# Patient Record
Sex: Male | Born: 1965 | Race: White | Hispanic: No | Marital: Single | Smoking: Never smoker
Health system: Southern US, Community
[De-identification: ages and names within clinical notes are randomized; demographics above are authoritative.]

## PROBLEM LIST (undated history)

## (undated) DIAGNOSIS — S022XXA Fracture of nasal bones, initial encounter for closed fracture: Secondary | ICD-10-CM

## (undated) DIAGNOSIS — E079 Disorder of thyroid, unspecified: Secondary | ICD-10-CM

## (undated) DIAGNOSIS — S42309A Unspecified fracture of shaft of humerus, unspecified arm, initial encounter for closed fracture: Secondary | ICD-10-CM

## (undated) DIAGNOSIS — B54 Unspecified malaria: Secondary | ICD-10-CM

## (undated) DIAGNOSIS — K589 Irritable bowel syndrome without diarrhea: Secondary | ICD-10-CM

## (undated) HISTORY — DX: Fracture of nasal bones, initial encounter for closed fracture: S02.2XXA

## (undated) HISTORY — DX: Disorder of thyroid, unspecified: E07.9

## (undated) HISTORY — DX: Unspecified fracture of shaft of humerus, unspecified arm, initial encounter for closed fracture: S42.309A

## (undated) HISTORY — DX: Unspecified malaria: B54

## (undated) HISTORY — DX: Irritable bowel syndrome, unspecified: K58.9

## (undated) HISTORY — PX: OTHER SURGICAL HISTORY: SHX169

## (undated) HISTORY — PX: EYE SURGERY: SHX253

## (undated) HISTORY — PX: TONSILLECTOMY AND ADENOIDECTOMY: SHX28

---

## 2013-03-29 ENCOUNTER — Ambulatory Visit: Payer: Self-pay | Admitting: Family Medicine

## 2013-03-29 VITALS — BP 105/70 | HR 60 | Temp 97.8°F | Resp 18 | Ht 75.0 in | Wt 214.0 lb

## 2013-03-29 DIAGNOSIS — E039 Hypothyroidism, unspecified: Secondary | ICD-10-CM

## 2013-03-29 LAB — TSH: TSH: 1.976 u[IU]/mL (ref 0.350–4.500)

## 2013-03-29 MED ORDER — LEVOTHYROXINE SODIUM 125 MCG PO TABS: 125.0000 ug | ORAL_TABLET | Freq: Every day | ORAL | Status: DC

## 2013-03-29 NOTE — Progress Notes (Signed)
[facility] [address] [place] [address] [phone]  Date:  03/29/2013   Name:  Michael Mccall   DOB:  [birth year]   MRN:  [id number]  PCP:  Kyung Rudd, MD    Chief Complaint: pre employment check up and rx refills   History of Present Illness:  Michael Mccall is a 47 y.o. very pleasant male patient who presents with the following:  He just got hired for a new job in [place].  He is a Primary school teacher projects He needs a physical- just for his job, would like "a basic physical."  He has already seen a travel clinic and had the appropriate vaccines He also needs a refill of his levothyroxine.  He would like to get a 6 month rx to tide him over during his time overseas He had a TSH check about 6 months ago.  He has been stable on his current does for 2 years.    He is quite healthy except for a history of hypothyroidism  There are no active problems to display for this patient.   Past Medical History  Diagnosis Date  . Thyroid disease     Past Surgical History  Procedure Laterality Date  . Eye surgery    . Nose repair      History  Substance Use Topics  . Smoking status: Never Smoker   . Smokeless tobacco: Not on file  . Alcohol Use: Not on file    History reviewed. No pertinent family history.  No Known Allergies  Medication list has been reviewed and updated.  No current outpatient prescriptions on file prior to visit.   No current facility-administered medications on file prior to visit.    Review of Systems:  As per HPI- otherwise negative.   Physical Examination: Filed Vitals:   03/29/13 1256  BP: 98/62  Pulse: 51  Temp: 97.8 F (36.6 C)  Resp: 18   Filed Vitals:   03/29/13 1256  Height: 6\' 3"  (1.905 m)  Weight: 214 lb (97.07 kg)   Body mass index is 26.75 kg/(m^2). Ideal Body Weight: Weight in (lb) to have BMI = 25: 199.6  GEN: WDWN, NAD, Non-toxic, A & O x 3, looks well HEENT:  Atraumatic, Normocephalic. Neck supple. No masses, No LAD.  Bilateral TM wnl, oropharynx normal.  PEERL,EOMI.   Ears and Nose: No external deformity. CV: RRR, No M/G/R. No JVD. No thrill. No extra heart sounds. PULM: CTA B, no wheezes, crackles, rhonchi. No retractions. No resp. distress. No accessory muscle use. ABD: S, NT, ND EXTR: No c/c/e NEURO Normal gait.  PSYCH: Normally interactive. Conversant. Not depressed or anxious appearing.  Calm demeanor.    Assessment and Plan: Unspecified hypothyroidism - Plan: TSH, levothyroxine (SYNTHROID, LEVOTHROID) 125 MCG tablet  Completed basic PE form for his job today.  Will call with TSH result- if necessary will change his dose of levothyroxine.  He will get a flu shot at the drug store.   Signed Abbe Amsterdam, MD

## 2013-03-29 NOTE — Patient Instructions (Addendum)
Have a great and safe experience!   I will be in touch with your TSH value in the next 1 or 2 days.  Assuming your TSH is normal we can continue your current medication dose.

## 2013-04-07 ENCOUNTER — Ambulatory Visit (INDEPENDENT_AMBULATORY_CARE_PROVIDER_SITE_OTHER): Payer: Self-pay | Admitting: Physician Assistant

## 2013-04-07 ENCOUNTER — Ambulatory Visit: Payer: Self-pay

## 2013-04-07 VITALS — BP 120/80 | HR 60 | Temp 98.0°F | Resp 16 | Ht 75.0 in | Wt 217.0 lb

## 2013-04-07 DIAGNOSIS — K589 Irritable bowel syndrome without diarrhea: Secondary | ICD-10-CM | POA: Insufficient documentation

## 2013-04-07 DIAGNOSIS — Z0000 Encounter for general adult medical examination without abnormal findings: Secondary | ICD-10-CM

## 2013-04-07 DIAGNOSIS — Z111 Encounter for screening for respiratory tuberculosis: Secondary | ICD-10-CM

## 2013-04-07 LAB — POCT URINALYSIS DIPSTICK
Bilirubin, UA: NEGATIVE
Glucose, UA: NEGATIVE
Ketones, UA: NEGATIVE
Spec Grav, UA: 1.02

## 2013-04-07 LAB — POCT UA - MICROSCOPIC ONLY
Bacteria, U Microscopic: NEGATIVE
RBC, urine, microscopic: NEGATIVE

## 2013-04-07 NOTE — Progress Notes (Signed)
Thanks Chelle

## 2013-04-07 NOTE — Patient Instructions (Signed)
Return in 48-72 hours for TB test reading. You may return anytime for fasting labs.

## 2013-04-07 NOTE — Progress Notes (Signed)
  Subjective:    Patient ID: Michael Mccall, male    DOB: 1966/04/24, 47 y.o.   MRN: [id number]  HPI  This 47 y.o. male presents for labs and additional items required for a PE for work.  He returned from the [place] this spring, and is preparing for a position in [place] with [employer].  He was seen here by Dr. Patsy Lager for the exam portion of this work PE earlier this month, but did not have the form or know the additional required labs, EKG and CXR.  Today he brought his vaccine record, which I reviewed and entered into the EMR.   Review of Systems     Objective:   Physical Exam Exam performed by Dr. Patsy Lager 03/29/2013 reviewed.  EKG: NSR, early repolarization.  No ischemia or irregular rhythm.  CXR: [facility] reading (PRIMARY) by  Dr. Clelia Croft.  Questionable borderline cardiomegaly.  No infiltrates or pneumothorax.      Assessment & Plan:  Routine general medical examination at a health care facility - Plan: EKG 12-Lead, POCT UA - Microscopic Only, POCT urinalysis dipstick, IFOBT POC (occult bld, rslt in office), DG Chest 2 View, POCT CBC, Comprehensive metabolic panel, RPR, Glucose 6 phosphate dehydrogenase  Screening-pulmonary TB - Plan: TB Skin Test  He'll return for the PPD reading and for fasting labs (CBC, CMET, G6PD, RPR), orders for which have been placed. Return the home collected hemosure as well.  Fernande Bras, PA-C Physician Assistant-Certified [facility]   Tuberculosis Risk Questionnaire  1. No Were you born outside the [place] in one of the following parts of the world: [place], [place], [place], [place] or [place]?    2. Yes ([place]) Have you traveled outside the [place] and lived for more than one month in one of the following parts of the world: [place], [place], [place], [place] or [place]?    3. No Do you have a compromised immune system such as from any of the following  conditions:HIV/AIDS, organ or bone marrow transplantation, diabetes, immunosuppressive medicines (e.g. Prednisone, Remicaide), leukemia, lymphoma, cancer of the head or neck, gastrectomy or jejunal bypass, end-stage renal disease (on dialysis), or silicosis?     4. No Have you ever or do you plan on working in: a residential care center, a health care facility, a jail or prison or homeless shelter?    5. No Have you ever: injected illegal drugs, used crack cocaine, lived in a homeless shelter  or been in jail or prison?     6. Yes  Have you ever been exposed to anyone with infectious tuberculosis?    Tuberculosis Symptom Questionnaire  Do you currently have any of the following symptoms?  1. No Unexplained cough lasting more than 3 weeks?   2. No Unexplained fever lasting more than 3 weeks.   3. No Night Sweats (sweating that leaves the bedclothes and sheets wet)     4. No Shortness of Breath   5. No Chest Pain   6. No Unintentional weight loss    7. No Unexplained fatigue (very tired for no reason)

## 2013-04-08 ENCOUNTER — Other Ambulatory Visit: Payer: Self-pay | Admitting: Physician Assistant

## 2013-04-08 ENCOUNTER — Other Ambulatory Visit (INDEPENDENT_AMBULATORY_CARE_PROVIDER_SITE_OTHER): Payer: Self-pay

## 2013-04-08 DIAGNOSIS — Z0000 Encounter for general adult medical examination without abnormal findings: Secondary | ICD-10-CM

## 2013-04-08 LAB — RPR

## 2013-04-08 LAB — POCT CBC
HCT, POC: 46 % (ref 43.5–53.7)
Hemoglobin: 14.9 g/dL (ref 14.1–18.1)
Lymph, poc: 1.9 (ref 0.6–3.4)
MCHC: 32.4 g/dL (ref 31.8–35.4)
MCV: 93.7 fL (ref 80–97)
POC LYMPH PERCENT: 33.3 %L (ref 10–50)
RDW, POC: 13.9 %
WBC: 5.6 10*3/uL (ref 4.6–10.2)

## 2013-04-08 LAB — COMPREHENSIVE METABOLIC PANEL
Albumin: 4.3 g/dL (ref 3.5–5.2)
Alkaline Phosphatase: 106 U/L (ref 39–117)
BUN: 11 mg/dL (ref 6–23)
CO2: 21 mEq/L (ref 19–32)
Glucose, Bld: 88 mg/dL (ref 70–99)
Total Bilirubin: 0.6 mg/dL (ref 0.3–1.2)

## 2013-04-08 LAB — GLUCOSE 6 PHOSPHATE DEHYDROGENASE: G-6PDH: 11.4 U/g Hgb (ref 7.0–20.5)

## 2013-04-08 LAB — IFOBT (OCCULT BLOOD): IFOBT: NEGATIVE

## 2013-04-08 NOTE — Progress Notes (Signed)
Patient here for labs only. 

## 2013-04-09 ENCOUNTER — Ambulatory Visit (INDEPENDENT_AMBULATORY_CARE_PROVIDER_SITE_OTHER): Payer: Self-pay | Admitting: Radiology

## 2013-04-09 DIAGNOSIS — Z111 Encounter for screening for respiratory tuberculosis: Secondary | ICD-10-CM

## 2013-04-09 LAB — TB SKIN TEST
Induration: 0 mm
TB Skin Test: NEGATIVE

## 2013-04-09 NOTE — Progress Notes (Signed)
  Subjective:    Patient ID: Michael Mccall, male    DOB: April 10, 1966, 47 y.o.   MRN: [id number]  HPI    Review of Systems     Objective:   Physical Exam        Assessment & Plan:

## 2013-04-09 NOTE — Progress Notes (Deleted)
  Subjective:    Patient ID: Michael Mccall, male    DOB: 05/19/1966, 47 y.o.   MRN: [id number]  HPI    Review of Systems     Objective:   Physical Exam        Assessment & Plan:   

## 2013-04-10 LAB — LIPID PANEL
Cholesterol: 165 mg/dL (ref 0–200)
HDL: 38 mg/dL — ABNORMAL LOW (ref >39)
Total CHOL/HDL Ratio: 4.3 Ratio

## 2013-04-10 LAB — URIC ACID: Uric Acid, Serum: 5.3 mg/dL (ref 4.0–7.8)

## 2014-01-09 ENCOUNTER — Ambulatory Visit (INDEPENDENT_AMBULATORY_CARE_PROVIDER_SITE_OTHER): Payer: Self-pay | Admitting: Physician Assistant

## 2014-01-09 ENCOUNTER — Encounter: Payer: Self-pay | Admitting: Physician Assistant

## 2014-01-09 ENCOUNTER — Telehealth: Payer: Self-pay | Admitting: Physician Assistant

## 2014-01-09 VITALS — BP 122/78 | HR 60 | Temp 98.6°F | Resp 16 | Ht 75.0 in | Wt 221.0 lb

## 2014-01-09 DIAGNOSIS — E039 Hypothyroidism, unspecified: Secondary | ICD-10-CM

## 2014-01-09 DIAGNOSIS — Z114 Encounter for screening for human immunodeficiency virus [HIV]: Secondary | ICD-10-CM

## 2014-01-09 DIAGNOSIS — Z113 Encounter for screening for infections with a predominantly sexual mode of transmission: Secondary | ICD-10-CM

## 2014-01-09 DIAGNOSIS — Z1159 Encounter for screening for other viral diseases: Secondary | ICD-10-CM

## 2014-01-09 LAB — CBC WITH DIFFERENTIAL/PLATELET
BASOS ABS: 0.1 10*3/uL (ref 0.0–0.1)
BASOS PCT: 1 % (ref 0–1)
Eosinophils Absolute: 0.1 10*3/uL (ref 0.0–0.7)
Eosinophils Relative: 1 % (ref 0–5)
HCT: 43.5 % (ref 39.0–52.0)
Hemoglobin: 15 g/dL (ref 13.0–17.0)
LYMPHS PCT: 34 % (ref 12–46)
Lymphs Abs: 2.4 10*3/uL (ref 0.7–4.0)
MCH: 30.2 pg (ref 26.0–34.0)
MCHC: 34.5 g/dL (ref 30.0–36.0)
MCV: 87.5 fL (ref 78.0–100.0)
Monocytes Absolute: 0.5 10*3/uL (ref 0.1–1.0)
Monocytes Relative: 7 % (ref 3–12)
NEUTROS ABS: 4.1 10*3/uL (ref 1.7–7.7)
NEUTROS PCT: 57 % (ref 43–77)
PLATELETS: 279 10*3/uL (ref 150–400)
RBC: 4.97 MIL/uL (ref 4.22–5.81)
RDW: 14 % (ref 11.5–15.5)
WBC: 7.2 10*3/uL (ref 4.0–10.5)

## 2014-01-09 LAB — LIPID PANEL
CHOLESTEROL: 176 mg/dL (ref 0–200)
HDL: 36 mg/dL — ABNORMAL LOW (ref >39)
LDL Cholesterol: 107 mg/dL — ABNORMAL HIGH (ref 0–99)
TRIGLYCERIDES: 165 mg/dL — AB (ref <150)
Total CHOL/HDL Ratio: 4.9 Ratio
VLDL: 33 mg/dL (ref 0–40)

## 2014-01-09 LAB — COMPREHENSIVE METABOLIC PANEL
ALK PHOS: 103 U/L (ref 39–117)
ALT: 31 U/L (ref 0–53)
AST: 18 U/L (ref 0–37)
Albumin: 4.5 g/dL (ref 3.5–5.2)
BUN: 12 mg/dL (ref 6–23)
CHLORIDE: 106 meq/L (ref 96–112)
CO2: 24 mEq/L (ref 19–32)
Calcium: 9.5 mg/dL (ref 8.4–10.5)
Creat: 0.93 mg/dL (ref 0.50–1.35)
Glucose, Bld: 82 mg/dL (ref 70–99)
POTASSIUM: 4.1 meq/L (ref 3.5–5.3)
SODIUM: 138 meq/L (ref 135–145)
Total Bilirubin: 0.3 mg/dL (ref 0.2–1.2)
Total Protein: 6.8 g/dL (ref 6.0–8.3)

## 2014-01-09 NOTE — Patient Instructions (Signed)
I will contact you with your lab results as soon as they are available.   If you have not heard from me in 2 weeks, please contact me.  The fastest way to get your results is to register for My Chart (see the instructions on the last page of this printout).   

## 2014-01-09 NOTE — Telephone Encounter (Signed)
Spoke with patient by phone, while he was in the exam room, to collect history related to his recent travel from [place].  Spoke with [name] at the Infection Prevention line. Michael Mccall is no longer on the watch list.

## 2014-01-09 NOTE — Progress Notes (Signed)
   Subjective:    Patient ID: Gery PrayMichael Scovill, male    DOB: 01-13-1966, 48 y.o.   MRN: [id number]   PCP: Abbe AmsterdamOPLAND,JESSICA, MD  Chief Complaint  Patient presents with  . Hypothyroidism    needs TSH   Medications, allergies, past medical history, surgical history, family history, social history and problem list reviewed and updated.   HPI  This patient presents for TSH.    He was brought to a room with a mask urgently after indicating that he recently returned from [place]. He was in [place] from 03/2013 until 01/05/2014.  He did not travel to [place], [place], [place] or [place].  At the end of his trip, he stayed in a hotel that was not as clean as usual, and after a night there, he developed traveller's diarrhea and started Cipro on 01/05/2014.  His last episode of loose stool was yesterday or the day before. He has a ST that developed at the same time, which he believes is due to a bad filter on the [facility] in his hotel room (when he turned it off, his ST and drainage improved, but it was too hot to keep it off).  He denies contact with anyone ill, attendance at any funerals.  In addition, he would like to be tested "for the whole panel."  He likes to make sure that he's healthy.  We discussed labs that he may want, and decided on CMET, CBC, Lipids, and STI screening.  Review of Systems No chest pain, SOB, HA, dizziness, vision change, N/V, constipation, dysuria, urinary urgency or frequency, myalgias, arthralgias or rash. No bleeding. No fever.     Objective:   Physical Exam  Constitutional: He is oriented to person, place, and time. He appears well-developed and well-nourished.  BP 122/78  Pulse 60  Temp(Src) 98.6 F (37 C)  Resp 16  Ht 6\' 3"  (1.905 m)  Wt 221 lb (100.245 kg)  BMI 27.62 kg/m2  SpO2 97%   Eyes: Conjunctivae are normal. No scleral icterus.  Neck: Neck supple. No thyromegaly present.  Cardiovascular: Normal rate, regular rhythm and normal heart sounds.     Pulmonary/Chest: Effort normal and breath sounds normal.  Lymphadenopathy:    He has no cervical adenopathy.  Neurological: He is alert and oriented to person, place, and time.  Skin: Skin is warm and dry.  Psychiatric: His speech is normal and behavior is normal. Judgment and thought content normal. His mood appears anxious. His affect is not angry, not blunt, not labile and not inappropriate. Cognition and memory are normal. He does not exhibit a depressed mood.          Assessment & Plan:  1. Unspecified hypothyroidism Await lab results. - POCT CBC - TSH - CBC with Differential - Comprehensive metabolic panel - Lipid panel  2. Routine screening for STI (sexually transmitted infection) - RPR - GC/Chlamydia Probe Amp  3. Need for hepatitis B screening test Completed 3 doses of vaccine series.   - Hepatitis B surface antibody  4. Need for hepatitis C screening test - Hepatitis C antibody  5. Screening for HIV (human immunodeficiency virus) - HIV antibody   Fernande Brashelle S. Jeffery, PA-C Physician Assistant-Certified [facility]

## 2014-01-10 ENCOUNTER — Encounter: Payer: Self-pay | Admitting: Physician Assistant

## 2014-01-10 DIAGNOSIS — Z789 Other specified health status: Secondary | ICD-10-CM | POA: Insufficient documentation

## 2014-01-10 LAB — RPR

## 2014-01-10 LAB — GC/CHLAMYDIA PROBE AMP
CT Probe RNA: NEGATIVE
GC Probe RNA: NEGATIVE

## 2014-01-10 LAB — HEPATITIS C ANTIBODY: HCV Ab: NEGATIVE

## 2014-01-10 LAB — HIV ANTIBODY (ROUTINE TESTING W REFLEX): HIV: NONREACTIVE

## 2014-01-10 LAB — TSH: TSH: 3.409 u[IU]/mL (ref 0.350–4.500)

## 2014-01-10 LAB — HEPATITIS B SURFACE ANTIBODY, QUANTITATIVE: Hepatitis B-Post: 66.3 m[IU]/mL

## 2014-04-04 ENCOUNTER — Telehealth: Payer: Self-pay

## 2014-04-04 DIAGNOSIS — E039 Hypothyroidism, unspecified: Secondary | ICD-10-CM

## 2014-04-04 NOTE — Telephone Encounter (Signed)
Patient is calling regarding synthroid rx. States he needs a refill and that his pharmacy sent the request today. He is calling to make sure we have received this. Please return call [phone]

## 2014-04-05 MED ORDER — LEVOTHYROXINE SODIUM 125 MCG PO TABS: ORAL_TABLET | ORAL | Status: DC

## 2014-04-05 NOTE — Telephone Encounter (Signed)
Did not receive a notification from the pharmacy. Sent in a 3 month supply per OV pt needs to make appt following this refill. Lm to notify pt.

## 2014-04-06 ENCOUNTER — Other Ambulatory Visit: Payer: Self-pay | Admitting: Family Medicine

## 2014-04-14 ENCOUNTER — Ambulatory Visit (INDEPENDENT_AMBULATORY_CARE_PROVIDER_SITE_OTHER): Payer: Self-pay | Admitting: Physician Assistant

## 2014-04-14 VITALS — BP 110/70 | HR 59 | Temp 97.6°F | Resp 16 | Ht 75.0 in | Wt 236.0 lb

## 2014-04-14 DIAGNOSIS — E039 Hypothyroidism, unspecified: Secondary | ICD-10-CM

## 2014-04-14 NOTE — Progress Notes (Signed)
I was directly involved with the patient's care and agree with the physical, diagnosis and treatment plan.  

## 2014-04-14 NOTE — Patient Instructions (Signed)
Hypothyroidism The thyroid is a large gland located in the lower front of your neck. The thyroid gland helps control metabolism. Metabolism is how your body handles food. It controls metabolism with the hormone thyroxine. When this gland is underactive (hypothyroid), it produces too little hormone.  CAUSES These include:   Absence or destruction of thyroid tissue.  Goiter due to iodine deficiency.  Goiter due to medications.  Congenital defects (since birth).  Problems with the pituitary. This causes a lack of TSH (thyroid stimulating hormone). This hormone tells the thyroid to turn out more hormone. SYMPTOMS  Lethargy (feeling as though you have no energy)  Cold intolerance  Weight gain (in spite of normal food intake)  Dry skin  Coarse hair  Menstrual irregularity (if severe, may lead to infertility)  Slowing of thought processes Cardiac problems are also caused by insufficient amounts of thyroid hormone. Hypothyroidism in the newborn is cretinism, and is an extreme form. It is important that this form be treated adequately and immediately or it will lead rapidly to retarded physical and mental development. DIAGNOSIS  To prove hypothyroidism, your caregiver may do blood tests and ultrasound tests. Sometimes the signs are hidden. It may be necessary for your caregiver to watch this illness with blood tests either before or after diagnosis and treatment. TREATMENT  Low levels of thyroid hormone are increased by using synthetic thyroid hormone. This is a safe, effective treatment. It usually takes about four weeks to gain the full effects of the medication. After you have the full effect of the medication, it will generally take another four weeks for problems to leave. Your caregiver may start you on low doses. If you have had heart problems the dose may be gradually increased. It is generally not an emergency to get rapidly to normal. HOME CARE INSTRUCTIONS   Take your  medications as your caregiver suggests. Let your caregiver know of any medications you are taking or start taking. Your caregiver will help you with dosage schedules.  As your condition improves, your dosage needs may increase. It will be necessary to have continuing blood tests as suggested by your caregiver.  Report all suspected medication side effects to your caregiver. SEEK MEDICAL CARE IF: Seek medical care if you develop:  Sweating.  Tremulousness (tremors).  Anxiety.  Rapid weight loss.  Heat intolerance.  Emotional swings.  Diarrhea.  Weakness. SEEK IMMEDIATE MEDICAL CARE IF:  You develop chest pain, an irregular heart beat (palpitations), or a rapid heart beat. MAKE SURE YOU:   Understand these instructions.  Will watch your condition.  Will get help right away if you are not doing well or get worse. Document Released: 07/07/2005 Document Revised: 09/29/2011 Document Reviewed: 02/25/2008 ExitCare Patient Information 2015 [employer]. This information is not intended to replace advice given to you by your health care provider. Make sure you discuss any questions you have with your health care provider. Constipation Constipation is when a person has fewer than three bowel movements a week, has difficulty having a bowel movement, or has stools that are dry, hard, or larger than normal. As people grow older, constipation is more common. If you try to fix constipation with medicines that make you have a bowel movement (laxatives), the problem may get worse. Long-term laxative use may cause the muscles of the colon to become weak. A low-fiber diet, not taking in enough fluids, and taking certain medicines may make constipation worse.  CAUSES   Certain medicines, such as antidepressants, pain medicine,   iron supplements, antacids, and water pills.   Certain diseases, such as diabetes, irritable bowel syndrome (IBS), thyroid disease, or depression.   Not drinking  enough water.   Not eating enough fiber-rich foods.   Stress or travel.   Lack of physical activity or exercise.   Ignoring the urge to have a bowel movement.   Using laxatives too much.  SIGNS AND SYMPTOMS   Having fewer than three bowel movements a week.   Straining to have a bowel movement.   Having stools that are hard, dry, or larger than normal.   Feeling full or bloated.   Pain in the lower abdomen.   Not feeling relief after having a bowel movement.  DIAGNOSIS  Your health care provider will take a medical history and perform a physical exam. Further testing may be done for severe constipation. Some tests may include:  A barium enema X-ray to examine your rectum, colon, and, sometimes, your small intestine.   A sigmoidoscopy to examine your lower colon.   A colonoscopy to examine your entire colon. TREATMENT  Treatment will depend on the severity of your constipation and what is causing it. Some dietary treatments include drinking more fluids and eating more fiber-rich foods. Lifestyle treatments may include regular exercise. If these diet and lifestyle recommendations do not help, your health care provider may recommend taking over-the-counter laxative medicines to help you have bowel movements. Prescription medicines may be prescribed if over-the-counter medicines do not work.  HOME CARE INSTRUCTIONS   Eat foods that have a lot of fiber, such as fruits, vegetables, whole grains, and beans.  Limit foods high in fat and processed sugars, such as french fries, hamburgers, cookies, candies, and soda.   A fiber supplement may be added to your diet if you cannot get enough fiber from foods.   Drink enough fluids to keep your urine clear or pale yellow.   Exercise regularly or as directed by your health care provider.   Go to the restroom when you have the urge to go. Do not hold it.   Only take over-the-counter or prescription medicines as  directed by your health care provider. Do not take other medicines for constipation without talking to your health care provider first.  SEEK IMMEDIATE MEDICAL CARE IF:   You have bright red blood in your stool.   Your constipation lasts for more than 4 days or gets worse.   You have abdominal or rectal pain.   You have thin, pencil-like stools.   You have unexplained weight loss. MAKE SURE YOU:   Understand these instructions.  Will watch your condition.  Will get help right away if you are not doing well or get worse. Document Released: 04/04/2004 Document Revised: 07/12/2013 Document Reviewed: 04/18/2013 ExitCare Patient Information 2015 [employer]. This information is not intended to replace advice given to you by your health care provider. Make sure you discuss any questions you have with your health care provider.  

## 2014-04-14 NOTE — Progress Notes (Signed)
Subjective:    Patient ID: Michael Mccall, male    DOB: Apr 06, 1966, 48 y.o.   MRN: [id number]  CC: Thyroid followup  HPI  Patient reports a 15 lb weight gain since his las visit.  Also reports feeling tired, and lethargic, along with some constipation, much like how he felt when he was initially diagnosed with hypothyroidism in the past.  He would like to have his TSH checked again today, and would like to have someone call him with the results.  He denies skin changes since his last visit, however, he reports "My skin is always a little dry."  He admits to not drinking enough water on a daily basis, and reports drinking too much coffee during the day.  Given his history of IBS, he reports eating a large amount of fiber on a daily basis.      No Known Allergies   History   Social History  . Marital Status: Single    Spouse Name: n/a    Number of Children: 0  . Years of Education: Master's   Occupational History  . Emergency planning/management officer    Social History Main Topics  . Smoking status: Never Smoker   . Smokeless tobacco: Never Used  . Alcohol Use: Yes     Comment: occasionally  . Drug Use: No   Other Topics Concern  . 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 Stays with his father in between jobs.   Tries to avoid gluten, feels better without it.   Current outpatient prescriptions:levothyroxine (SYNTHROID, LEVOTHROID) 125 MCG tablet, Take 1 tablet (125 mcg total) by mouth daily before breakfast., Disp: 180 tablet, Rfl: 0  Past Surgical History  Procedure Laterality Date  . Nose repair    . Tonsillectomy and adenoidectomy    . Eye surgery      laser     Review of Systems  Constitutional: Positive for fatigue. Negative for fever.  HENT: Negative.   Eyes: Negative.   Respiratory: Negative.   Cardiovascular: Negative.   Gastrointestinal: Positive for constipation.  Endocrine: Negative for cold  intolerance and heat intolerance.  Musculoskeletal: Negative.   Skin: Negative.        Objective:   Physical Exam  Constitutional: He is oriented to person, place, and time. He appears well-developed and well-nourished.  Eyes: Conjunctivae and EOM are normal. Pupils are equal, round, and reactive to light. Right eye exhibits no discharge. Left eye exhibits no discharge.  Neck: Normal range of motion. Neck supple. No mass and no thyromegaly present.  Cardiovascular: Regular rhythm and normal heart sounds.   Pulmonary/Chest: Effort normal and breath sounds normal. No stridor.  Abdominal: There is no tenderness.  Neurological: He is alert and oriented to person, place, and time. He has normal reflexes. He displays normal reflexes. No cranial nerve deficit.  Skin: Skin is warm and dry.          Assessment & Plan:   Unspecified hypothyroidism - Plan: TSH, patient reports symptoms concerning for reemergence of hypothyroidism, particularly fatigue and constipation. Should the result be abnormal, his medication will be adjusted.  Should it come back normal, other causes of his fatigue should be investigated, such as anemia or electrolyte abnormalities.   Fiber was discussed for constipation, along with maintaining adequate hydration.      Deliah Boston, MS, PA-C [facility] 04/14/2014 5:47  PM   

## 2014-04-15 LAB — TSH: TSH: 3.121 u[IU]/mL (ref 0.350–4.500)

## 2014-04-21 ENCOUNTER — Telehealth: Payer: Self-pay

## 2014-04-21 NOTE — Telephone Encounter (Signed)
Message copied by Johnnette LitterARDWELL, Zaidyn Claire M on Fri Apr 21, 2014  6:26 PM ------      Message from: Ofilia NeasLARK, Damieon L      Created: Fri Apr 21, 2014  6:22 PM       Please call pt and make him aware of normal result.  Should he want to know the number it is 3.121 and the normal range is  ([phone]) ------

## 2014-04-21 NOTE — Telephone Encounter (Signed)
LMOM that his thyroid was normal.

## 2014-04-25 ENCOUNTER — Telehealth: Payer: Self-pay

## 2014-04-25 NOTE — Telephone Encounter (Signed)
Pt called requesting his thyroid results. I let him know the results. Pt only has 3 mos of his Synthroid. Advised to have his pharm send us a request in 3 mos. Michael Mccall doesn't need to see pt back until 6 mos

## 2014-10-10 ENCOUNTER — Ambulatory Visit (INDEPENDENT_AMBULATORY_CARE_PROVIDER_SITE_OTHER): Payer: Self-pay | Admitting: Family Medicine

## 2014-10-10 VITALS — BP 118/66 | HR 69 | Temp 97.7°F | Resp 16 | Ht 75.0 in | Wt 250.0 lb

## 2014-10-10 DIAGNOSIS — Z0000 Encounter for general adult medical examination without abnormal findings: Secondary | ICD-10-CM

## 2014-10-10 NOTE — Progress Notes (Signed)
Subjective:  This chart was scribed for Estelle JuneKurt Lauentsein MD by Richarda Overlieichard Holland, Medical scribe. This patient was seen in ROOM 1 and the patient's care was started 4:32 PM.   Patient ID: Michael Mccall, male    DOB: [birth year], 49 y.o.   MRN: [id number]   Chief Complaint  Patient presents with   Employment Physical   HPI HPI Comments: Michael Mccall is a [age] y.o. male with a history of IBS and hypothyroidism who presents to [facility] for an employment physical for [employer]. Pt complains of no symptoms at this time. He states he has worked internationally in [place] and has had all his required immunizations in the past. He states that he believes he had food poisoning a few weeks ago but is asymptomatic at this time. He states his last tetanus shot was in 2014. Pt reports no alleviating or modifying factors at this time.   Patient Active Problem List   Diagnosis Date Noted   Immune to hepatitis B 01/10/2014   IBS (irritable bowel syndrome)    Hypothyroidism 03/29/2013   Past Medical History  Diagnosis Date   Thyroid disease    IBS (irritable bowel syndrome)    Nasal fracture    Malaria    Arm fracture    No Known Allergies Current Outpatient Prescriptions on File Prior to Visit  Medication Sig Dispense Refill   levothyroxine (SYNTHROID, LEVOTHROID) 125 MCG tablet Take 1 tablet (125 mcg total) by mouth daily before breakfast. 180 tablet 0   No current facility-administered medications on file prior to visit.   Family History  Problem Relation Age of Onset   Diabetes Brother    Past Surgical History  Procedure Laterality Date   Nose repair     Tonsillectomy and adenoidectomy     Eye surgery      laser   History   Social History   Marital Status: Single    Spouse Name: n/a   Number of Children: 0   Years of Education: Master's   Occupational History   Emergency planning/management officerproject manager    Social History Main Topics   Smoking status: Never Smoker     Smokeless tobacco: Never Used   Alcohol Use: Yes     Comment: occasionally   Drug Use: No   Sexual Activity: Not on file   Other Topics Concern   Not on file   Social History Narrative   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 Stays with his father in between jobs.   Tries to avoid gluten, feels better without it.    Review of Systems  All other systems reviewed and are negative.      Objective:   Physical Exam  Constitutional: He is oriented to person, place, and time. He appears well-developed and well-nourished.  HENT:  Head: Normocephalic and atraumatic.  Eyes: Right eye exhibits no discharge. Left eye exhibits no discharge.  Neck: Neck supple. No tracheal deviation present.  Cardiovascular: Normal rate.   Pulmonary/Chest: Effort normal. No respiratory distress.  Abdominal: He exhibits no distension.  Neurological: He is alert and oriented to person, place, and time.  Skin: Skin is warm and dry.  Psychiatric: He has a normal mood and affect. His behavior is normal.  Nursing note and vitals reviewed.  Filed Vitals:   10/10/14 1624  BP: 118/66  Pulse: 69  Temp: 97.7 F (36.5 C)  TempSrc: Oral  Resp: 16  Height: 6\' 3"  (1.905 m)  Weight: 250 lb (113.399 kg)  SpO2: 96%   See form.      Assessment & Plan:  This chart was scribed in my presence and reviewed by me personally.    ICD-9-CM ICD-10-CM   1. Annual physical exam V70.0 Z00.00      Signed, Elvina Sidle, MD

## 2015-03-28 ENCOUNTER — Ambulatory Visit (INDEPENDENT_AMBULATORY_CARE_PROVIDER_SITE_OTHER): Payer: Self-pay | Admitting: Urgent Care

## 2015-03-28 VITALS — BP 106/68 | HR 70 | Temp 98.2°F | Resp 17 | Ht 75.0 in | Wt 244.0 lb

## 2015-03-28 DIAGNOSIS — E039 Hypothyroidism, unspecified: Secondary | ICD-10-CM

## 2015-03-28 LAB — THYROID PANEL WITH TSH
FREE THYROXINE INDEX: 2.6 (ref 1.4–3.8)
T3 Uptake: 29 % (ref 22–35)
T4 TOTAL: 8.9 ug/dL (ref 4.5–12.0)
TSH: 3.692 u[IU]/mL (ref 0.350–4.500)

## 2015-03-28 NOTE — Patient Instructions (Signed)
Hypothyroidism The thyroid is a large gland located in the lower front of your neck. The thyroid gland helps control metabolism. Metabolism is how your body handles food. It controls metabolism with the hormone thyroxine. When this gland is underactive (hypothyroid), it produces too little hormone.  CAUSES These include:   Absence or destruction of thyroid tissue.  Goiter due to iodine deficiency.  Goiter due to medications.  Congenital defects (since birth).  Problems with the pituitary. This causes a lack of TSH (thyroid stimulating hormone). This hormone tells the thyroid to turn out more hormone. SYMPTOMS  Lethargy (feeling as though you have no energy)  Cold intolerance  Weight gain (in spite of normal food intake)  Dry skin  Coarse hair  Menstrual irregularity (if severe, may lead to infertility)  Slowing of thought processes Cardiac problems are also caused by insufficient amounts of thyroid hormone. Hypothyroidism in the newborn is cretinism, and is an extreme form. It is important that this form be treated adequately and immediately or it will lead rapidly to retarded physical and mental development. DIAGNOSIS  To prove hypothyroidism, your caregiver may do blood tests and ultrasound tests. Sometimes the signs are hidden. It may be necessary for your caregiver to watch this illness with blood tests either before or after diagnosis and treatment. TREATMENT  Low levels of thyroid hormone are increased by using synthetic thyroid hormone. This is a safe, effective treatment. It usually takes about four weeks to gain the full effects of the medication. After you have the full effect of the medication, it will generally take another four weeks for problems to leave. Your caregiver may start you on low doses. If you have had heart problems the dose may be gradually increased. It is generally not an emergency to get rapidly to normal. HOME CARE INSTRUCTIONS   Take your  medications as your caregiver suggests. Let your caregiver know of any medications you are taking or start taking. Your caregiver will help you with dosage schedules.  As your condition improves, your dosage needs may increase. It will be necessary to have continuing blood tests as suggested by your caregiver.  Report all suspected medication side effects to your caregiver. SEEK MEDICAL CARE IF: Seek medical care if you develop:  Sweating.  Tremulousness (tremors).  Anxiety.  Rapid weight loss.  Heat intolerance.  Emotional swings.  Diarrhea.  Weakness. SEEK IMMEDIATE MEDICAL CARE IF:  You develop chest pain, an irregular heart beat (palpitations), or a rapid heart beat. MAKE SURE YOU:   Understand these instructions.  Will watch your condition.  Will get help right away if you are not doing well or get worse. Document Released: 07/07/2005 Document Revised: 09/29/2011 Document Reviewed: 02/25/2008 ExitCare Patient Information 2015 [employer]. This information is not intended to replace advice given to you by your health care provider. Make sure you discuss any questions you have with your health care provider.  

## 2015-03-28 NOTE — Progress Notes (Signed)
    MRN: [id number] DOB: 1966-01-27  Subjective:   Michael Mccall is a 49 y.o. male presenting for follow up on hypothyroidism, medication refill. Reports 10 he has done well with 125 mcg thyroxine for the past 3 years. Denies depression, thinning of hair, dry skin, heart racing, palpitations, weight gain. He does admit intermittent constipation but this is unchanged and has a diagnosis of IBS constipation type. Denies smoking cigarettes, drinks alcohol occasionally. Denies any other aggravating or relieving factors, no other questions or concerns.  Michael Mccall has a current medication list which includes the following prescription(s): levothyroxine. Also has No Known Allergies.  Michael Mccall  has a past medical history of Thyroid disease; IBS (irritable bowel syndrome); Nasal fracture; Malaria; and Arm fracture. Also  has past surgical history that includes nose repair; Tonsillectomy and adenoidectomy; and Eye surgery.  Objective:   Vitals: BP 106/68 mmHg  Pulse 70  Temp(Src) 98.2 F (36.8 C) (Oral)  Resp 17  Ht  (1.905 m)  Wt 244 lb (110.678 kg)  BMI 30.50 kg/m2  SpO2 97%  BP Readings from Last 3 Encounters:  03/28/15 106/68  10/10/14 118/66  04/14/14 110/70   Physical Exam  Constitutional: He is oriented to person, place, and time. He appears well-developed and well-nourished.  HENT:  Mouth/Throat: Oropharynx is clear and moist.  Eyes: No scleral icterus.  Neck: Normal range of motion. Neck supple. No thyromegaly present.  Cardiovascular: Normal rate, regular rhythm and intact distal pulses.  Exam reveals no gallop and no friction rub.   No murmur heard. Pulmonary/Chest: No respiratory distress. He has no wheezes. He has no rales.  Lymphadenopathy:    He has no cervical adenopathy.  Neurological: He is alert and oriented to person, place, and time.  Skin: Skin is warm and dry. No rash noted. No erythema. No pallor.  Psychiatric: He has a normal mood and affect.   Assessment  and Plan :   1. Hypothyroidism, unspecified hypothyroidism type - Thyroid Panel With TSH pending, will refill thyroid medication based off of thyroid lab results. Patient said it's okay to leave a voicemail.  Wallis Bamberg, PA-C [facility] [phone] 03/28/2015 4:10 PM

## 2015-03-29 ENCOUNTER — Telehealth: Payer: Self-pay | Admitting: Urgent Care

## 2015-03-29 DIAGNOSIS — E039 Hypothyroidism, unspecified: Secondary | ICD-10-CM

## 2015-03-29 MED ORDER — LEVOTHYROXINE SODIUM 125 MCG PO TABS: 125.0000 ug | ORAL_TABLET | Freq: Every day | ORAL | Status: DC

## 2015-03-29 NOTE — Telephone Encounter (Signed)
Well controlled with current dose as seen with his thyroid panel. I let patient know that he can pick up his script at his pharmacy.

## 2015-03-30 ENCOUNTER — Other Ambulatory Visit: Payer: Self-pay | Admitting: Physician Assistant

## 2015-06-20 IMAGING — CR DG CHEST 2V
3 series · 3 of 3 positions shown · non-contrast
Comparison: None.

CLINICAL DATA: General medical examination

CHEST - 2 VIEW

[PA (1 of 2)]
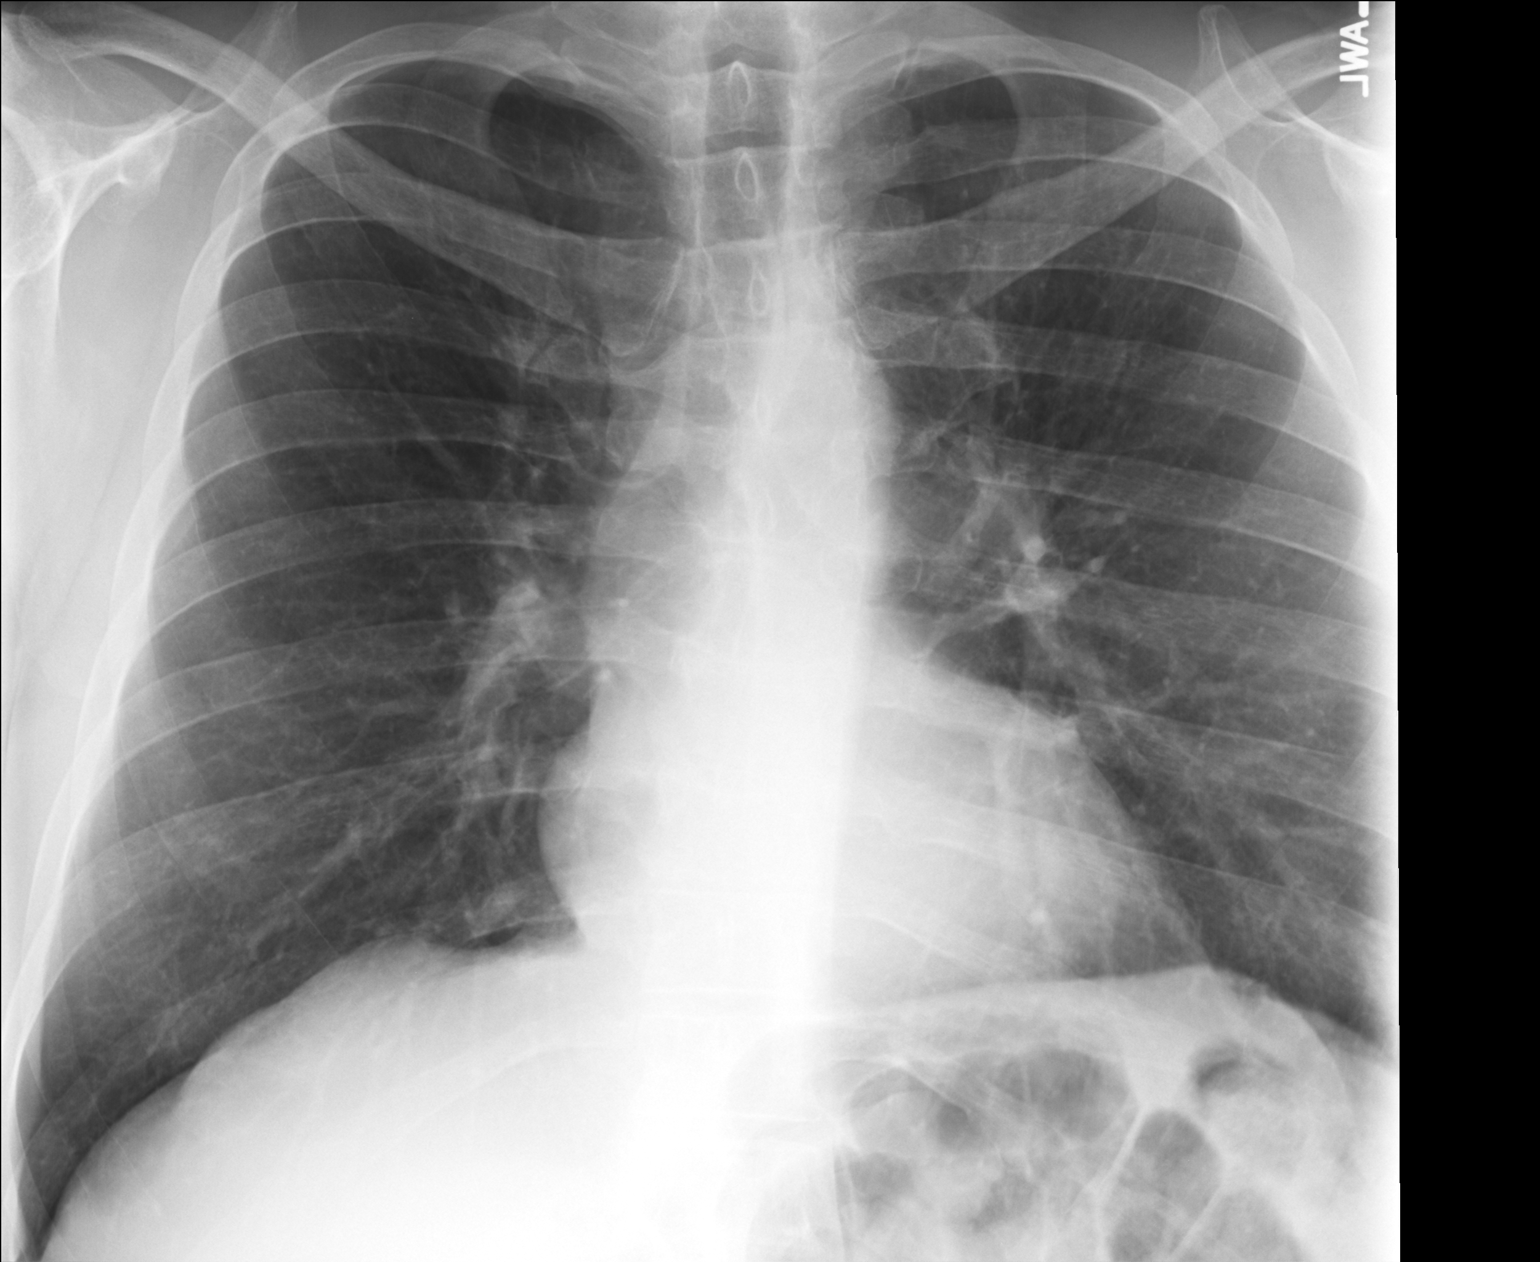

[lateral]
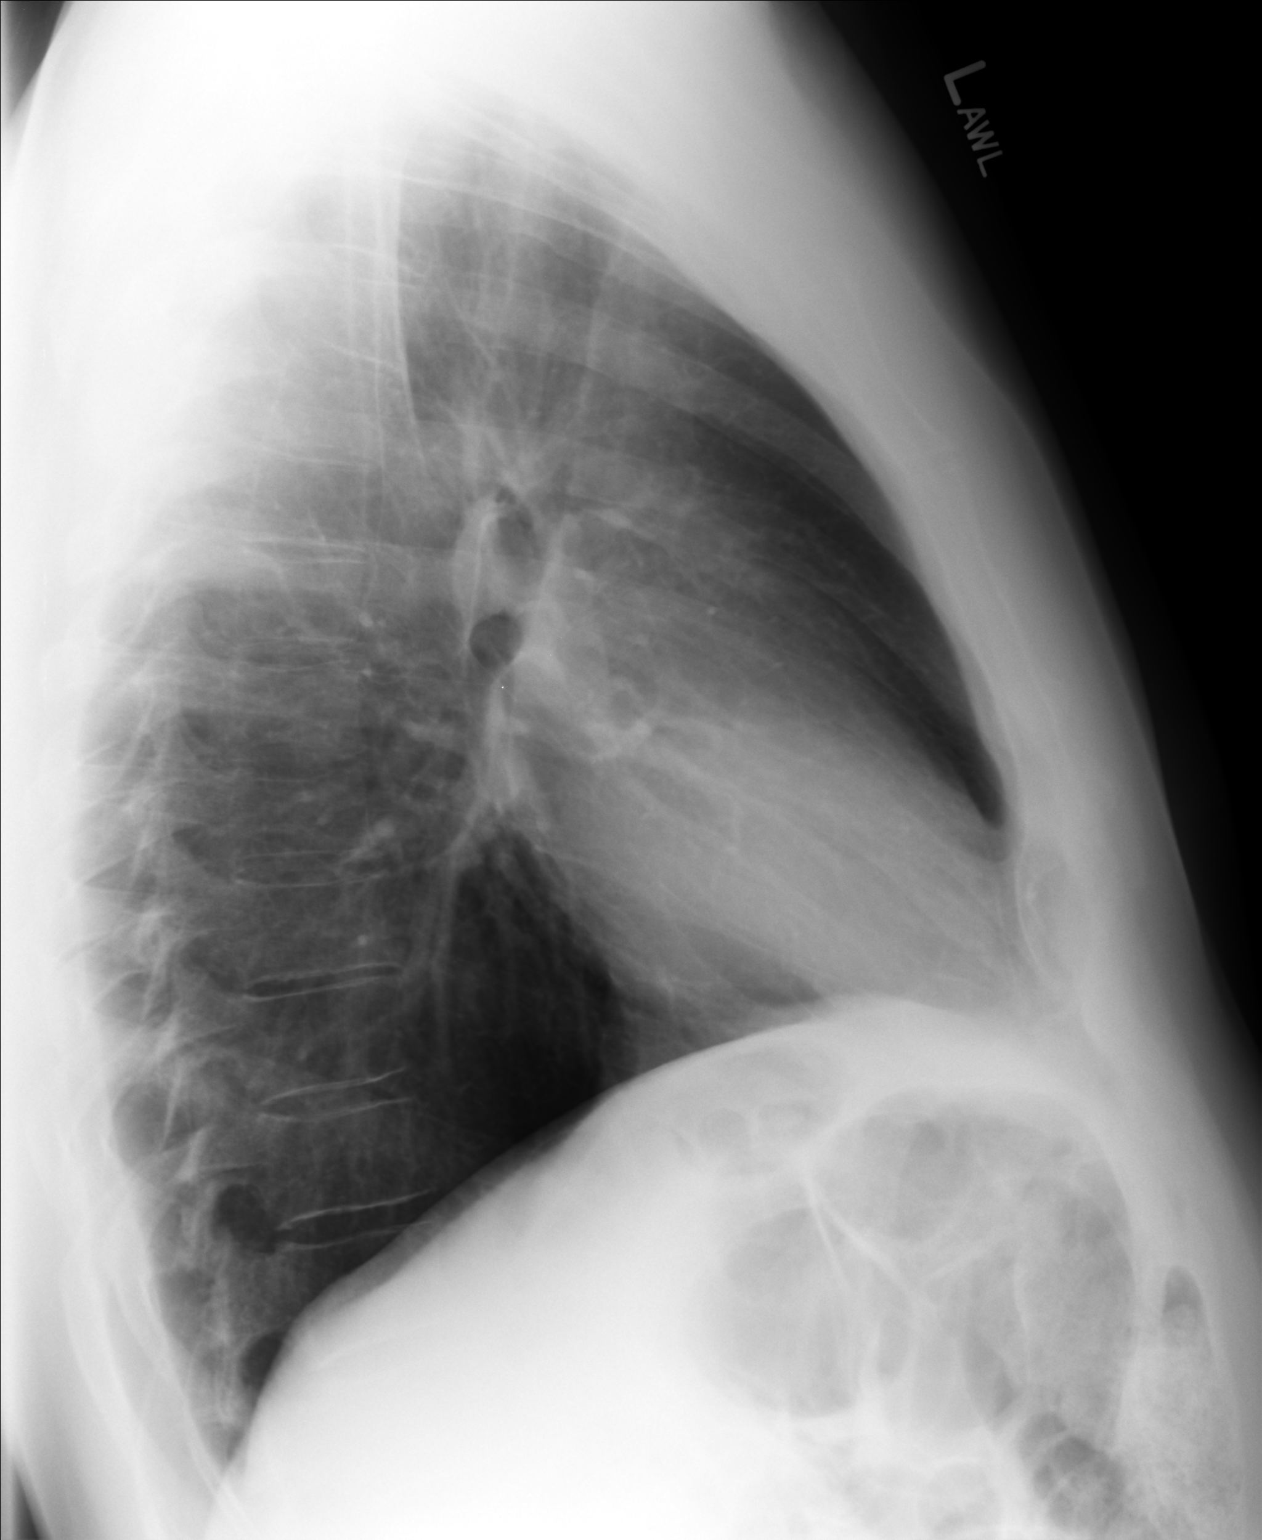

[PA (2 of 2)]
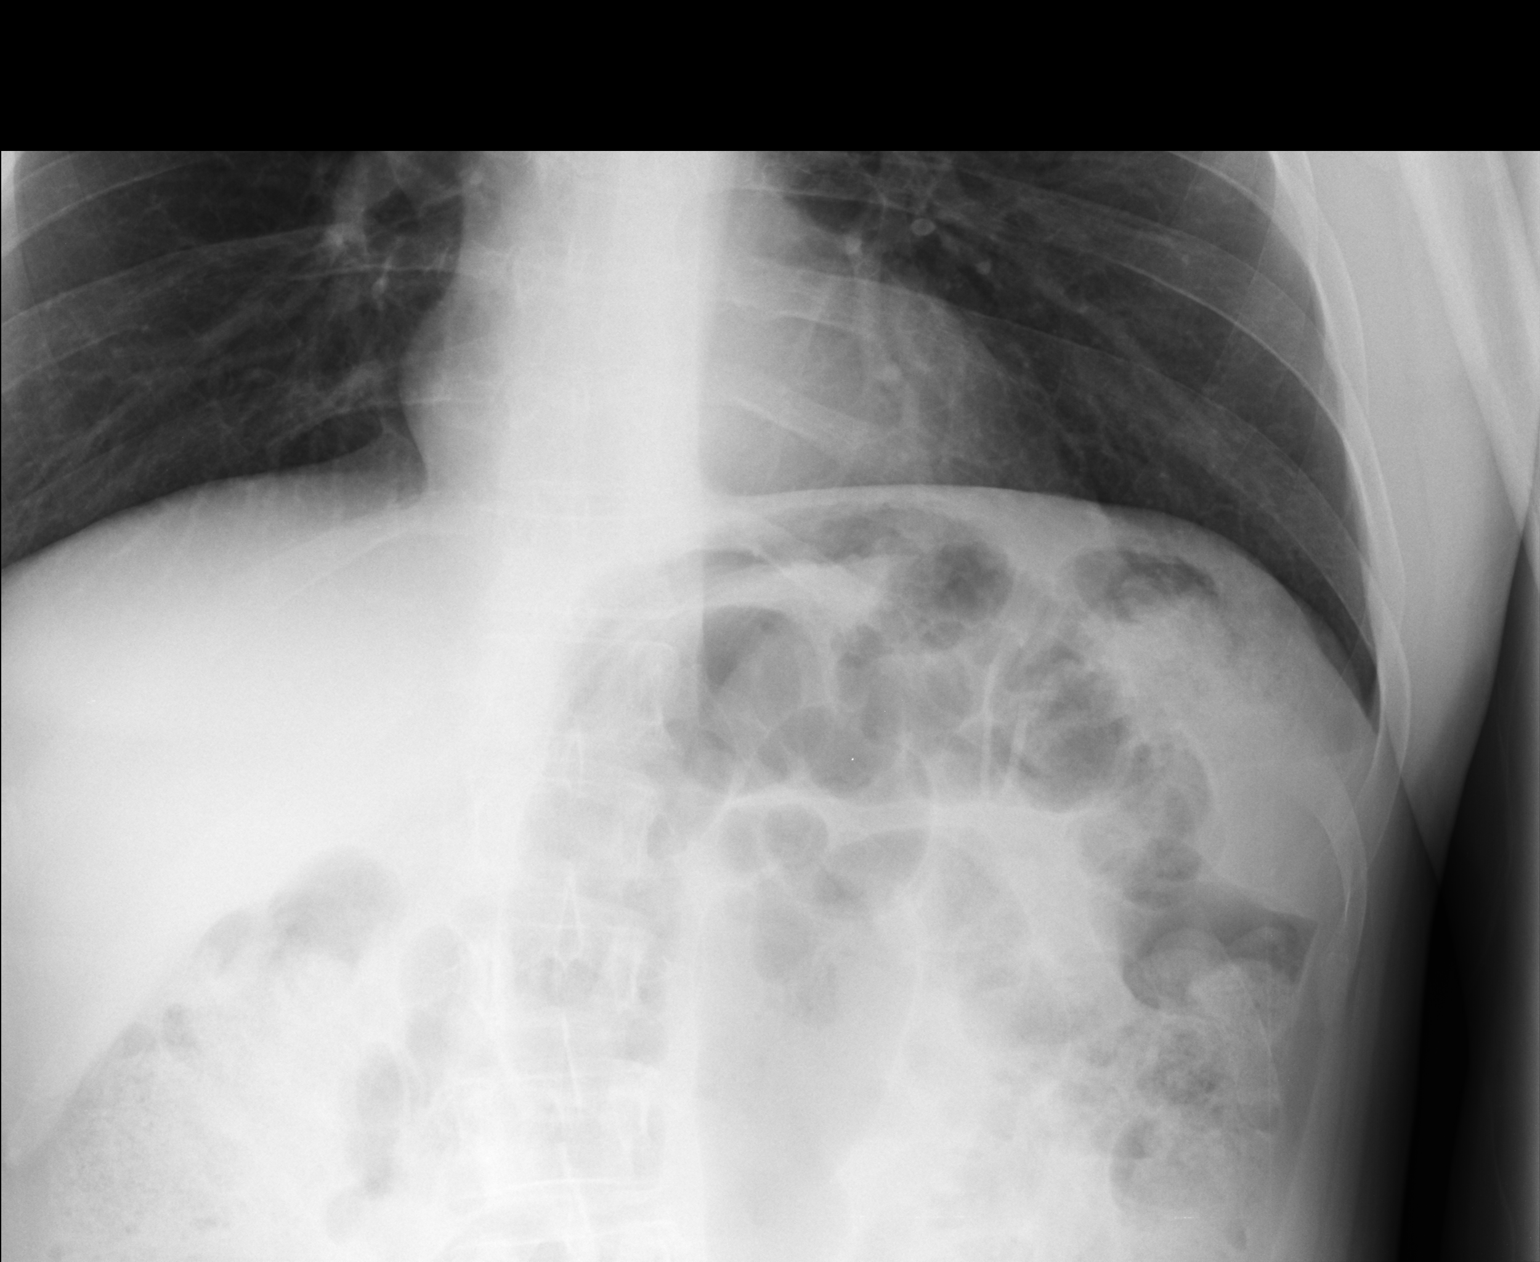

[3 of 3 positions shown; findings below may reference images not displayed]

FINDINGS: Cardiomediastinal silhouette appears normal.  No acute
pulmonary disease is noted.  Bony thorax is intact. No pleural
effusion or pneumothorax is noted.
IMPRESSION: No acute cardiopulmonary abnormality seen.

Clinically significant discrepancy from primary report, if
provided: None

## 2015-07-09 ENCOUNTER — Telehealth: Payer: Self-pay

## 2015-07-09 ENCOUNTER — Telehealth: Payer: Self-pay | Admitting: Family Medicine

## 2015-07-09 DIAGNOSIS — E039 Hypothyroidism, unspecified: Secondary | ICD-10-CM

## 2015-07-09 NOTE — Telephone Encounter (Signed)
levothyroxine (SYNTHROID, LEVOTHROID) 125 MCG tablet  Spilled his tablet   [phone] (H)

## 2015-07-09 NOTE — Telephone Encounter (Signed)
Can we refill? 

## 2015-07-09 NOTE — Telephone Encounter (Signed)
[facility] because patient needs a refill on Levothyroxine. His last rx, 03/29/2015 # 180,  fell out all over the floor and was not comfortable taking them. He no longer has them.  Please advise

## 2015-07-10 MED ORDER — LEVOTHYROXINE SODIUM 125 MCG PO TABS: 125.0000 ug | ORAL_TABLET | Freq: Every day | ORAL | Status: DC

## 2015-07-10 NOTE — Telephone Encounter (Signed)
Refilling for 90 days.  Please alert patient.

## 2015-07-10 NOTE — Telephone Encounter (Signed)
Left message Rx was sent in

## 2015-08-06 ENCOUNTER — Other Ambulatory Visit: Payer: Self-pay | Admitting: Physician Assistant

## 2015-08-10 ENCOUNTER — Encounter: Payer: Self-pay | Admitting: Family Medicine

## 2015-08-15 ENCOUNTER — Encounter: Payer: Self-pay | Admitting: Family Medicine

## 2016-04-08 ENCOUNTER — Telehealth: Payer: Self-pay

## 2016-04-08 MED ORDER — LEVOTHYROXINE SODIUM 125 MCG PO TABS: ORAL_TABLET | ORAL | 0 refills | Status: DC

## 2016-04-08 NOTE — Telephone Encounter (Signed)
Sent in 1 mos RF and notified pt he will need to come in or est with new provider for more. Pt agreed.

## 2016-04-08 NOTE — Telephone Encounter (Signed)
PATIENT WOULD LIKE TO GET AT LEAST 1 MORE MONTH OF HIS LEVOTHYROXINE 125 MCG. HE STATES HE LIVES IN [place] NOW AND HE NEEDS TO FIND A PHYSICIAN THERE. HE CAN COME BACK HERE TO GET HIS LABS DONE BUT HE JUST NEEDS SOME TIME TO DO IT. HE IS DOWN TO HIS LAST PILL. BEST PHONE [phone] (CELL)  [facility] [address].  MBC

## 2016-04-16 ENCOUNTER — Ambulatory Visit (INDEPENDENT_AMBULATORY_CARE_PROVIDER_SITE_OTHER): Payer: Self-pay | Admitting: Physician Assistant

## 2016-04-16 VITALS — BP 122/78 | HR 73 | Temp 98.8°F | Resp 17 | Ht 75.0 in | Wt 272.0 lb

## 2016-04-16 DIAGNOSIS — B009 Herpesviral infection, unspecified: Secondary | ICD-10-CM

## 2016-04-16 DIAGNOSIS — E039 Hypothyroidism, unspecified: Secondary | ICD-10-CM

## 2016-04-16 DIAGNOSIS — Z23 Encounter for immunization: Secondary | ICD-10-CM

## 2016-04-16 MED ORDER — PENCICLOVIR 1 % EX CREA: 1.0000 "application " | TOPICAL_CREAM | CUTANEOUS | 0 refills | Status: AC

## 2016-04-16 MED ORDER — LEVOTHYROXINE SODIUM 125 MCG PO TABS: ORAL_TABLET | ORAL | 0 refills | Status: DC

## 2016-04-16 MED ORDER — VALACYCLOVIR HCL 1 G PO TABS: 1000.0000 mg | ORAL_TABLET | Freq: Two times a day (BID) | ORAL | 2 refills | Status: AC

## 2016-04-16 NOTE — Patient Instructions (Addendum)
Some people find a daily supplement of Lysine helps reduce the amount of cold sore outbreaks.  I will contact you with your TSH results.   Thank you for coming in today. I hope you feel we met your needs.  Feel free to call [facility] if you have any questions or further requests.  Please consider signing up for MyChart if you do not already have it, as this is a great way to communicate with me.  Best,  Whitney McVey, PA-C   IF you received an x-ray today, you will receive an invoice from [facility]. Please contact [facility] at [phone] with questions or concerns regarding your invoice.   IF you received labwork today, you will receive an invoice from [employer]. Please contact [facility] at [phone] with questions or concerns regarding your invoice.   Our billing staff will not be able to assist you with questions regarding bills from these companies.  You will be contacted with the lab results as soon as they are available. The fastest way to get your results is to activate your My Chart account. Instructions are located on the last page of this paperwork. If you have not heard from [place] regarding the results in 2 weeks, please contact this office.

## 2016-04-16 NOTE — Addendum Note (Signed)
Addended by: Sebastian AcheMCVEY, Trisha Morandi WHITNEY on: 04/16/2016 03:10 PM   Modules accepted: Orders

## 2016-04-16 NOTE — Progress Notes (Signed)
   Michael Mccall  MRN: [id number] DOB: Mar 20, 1966  PCP: Abbe AmsterdamOPLAND,JESSICA, MD  Subjective:  Pt is a 50 year old male who presents to clinic for follow-up hypothyroid and refill of Synthroid and valtrex.   Hypothyroidism - Was diagnosed with hypothyroidism in 2011, endocrinologist prescribed 125 mcg Synthroid.  No complaints with medication. He has one pill left. Feeling well today. Notes weight gain this summer, states this is due to poor diet and lifestyle. Denies hair loss, fatigue, dry skin, palpitations, abnormal bowel movements.   Herpes - Since he was a child. 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 Notes his outbreaks are "really bad" painful, open blisters that drain. Is out of his medication and would like refill.   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 He plans to establish care there.   Review of Systems  Constitutional: Negative for activity change, appetite change, chills, diaphoresis, fatigue and unexpected weight change.  Cardiovascular: Negative.   Gastrointestinal: Negative for constipation and diarrhea.  Endocrine: Negative for cold intolerance and heat intolerance.  Skin: Negative.   Neurological: Negative for weakness, light-headedness and headaches.  Psychiatric/Behavioral: Negative for agitation. The patient is not nervous/anxious.     Patient Active Problem List   Diagnosis Date Noted  . Immune to hepatitis B 01/10/2014  . IBS (irritable bowel syndrome)   . Hypothyroidism 03/29/2013    Current Outpatient Prescriptions on File Prior to Visit  Medication Sig Dispense Refill  . levothyroxine (SYNTHROID, LEVOTHROID) 125 MCG tablet TAKE 1 TABLET BY MOUTH ONCE DAILY BEFORE BREAKFAST 30 tablet 0   No current facility-administered medications on file prior to visit.     No Known Allergies  Objective:  BP 122/78 (BP Location: Left Arm, Patient Position: Sitting, Cuff Size: Large)   Pulse 73    Temp 98.8 F (37.1 C) (Oral)   Resp 17   Ht 6\' 3"  (1.905 m)   Wt 272 lb (123.4 kg)   SpO2 94%   BMI 34.00 kg/m   Physical Exam  Constitutional: He is oriented to person, place, and time and well-developed, well-nourished, and in no distress. No distress.  Neck: No thyroid mass and no thyromegaly present.  Cardiovascular: Normal rate, regular rhythm and normal heart sounds.   Pulmonary/Chest: Effort normal and breath sounds normal. No respiratory distress.  Neurological: He is alert and oriented to person, place, and time. GCS score is 15.  Skin: Skin is warm and dry.  Psychiatric: Mood, memory, affect and judgment normal.  Vitals reviewed.   Assessment and Plan :  1. Hypothyroidism, unspecified hypothyroidism type - TSH lab pending. Patient has one pill left. Will refill medication and call patient is lab work is abnormal.  - levothyroxine (SYNTHROID, LEVOTHROID) 125 MCG tablet; TAKE 1 TABLET BY MOUTH ONCE DAILY BEFORE BREAKFAST  Dispense: 30 tablet; Refill: 0  2. Herpes - valACYclovir (VALTREX) 1000 MG tablet; Take 1 tablet (1,000 mg total) by mouth 2 (two) times daily.  Dispense: 10 tablet; Refill: 2 - penciclovir (DENAVIR) 1 % cream; Apply 1 application topically every 2 (two) hours.  Dispense: 1.5 g; Refill: 0  3. Needs flu shot - Flu Vaccine QUAD 36+ mos IM   Marco CollieWhitney Eriko Economos, PA-C  [facility] 04/16/2016 2:20 PM

## 2016-04-17 LAB — THYROID PANEL WITH TSH
Free Thyroxine Index: 2.7 (ref 1.4–3.8)
T3 Uptake: 31 % (ref 22–35)
T4, Total: 8.6 ug/dL (ref 4.5–12.0)
TSH: 4.72 mIU/L — ABNORMAL HIGH (ref 0.40–4.50)

## 2016-04-18 ENCOUNTER — Encounter: Payer: Self-pay | Admitting: Physician Assistant

## 2016-04-18 NOTE — Progress Notes (Signed)
Your TSH is slightly elevated, however your T3 and T4 levels are within normal limits. This indicates your medication is working well. There is no indication at this time for medication dose change. If you find a new provider in [place], we can send your medical records so they can continue to follow the trend of your TSH levels.

## 2016-07-07 ENCOUNTER — Other Ambulatory Visit: Payer: Self-pay

## 2016-07-07 ENCOUNTER — Telehealth: Payer: Self-pay

## 2016-07-07 ENCOUNTER — Other Ambulatory Visit: Payer: Self-pay | Admitting: Emergency Medicine

## 2016-07-07 MED ORDER — LEVOTHYROXINE SODIUM 125 MCG PO TABS: ORAL_TABLET | ORAL | 0 refills | Status: DC

## 2016-07-07 NOTE — Telephone Encounter (Signed)
Left a message on VM Levothyroxine 125 mcg e-scribed to pharmacy Advised to f/u with new PCP for further refills and blood work

## 2016-07-07 NOTE — Telephone Encounter (Signed)
Pt has moved to [place] and is needing a refill on his levothayroxine and [facility] [address] [phone]  Pt states that he had labs back at his last visit that was to decided if this medication needed to be changed and then called it but it has not happened   He states that the pharmacy has been giving him emergency supplies but he states that this cant be done any longer and needs a rx sent in   Best number [phone]

## 2016-08-16 ENCOUNTER — Telehealth: Payer: Self-pay

## 2016-08-16 NOTE — Telephone Encounter (Signed)
Pt needs a refill on his synthroid.  He says he has been getting this medication since 2013 here.  He would come in, get his blood work done and then they would call him in a year's worth of the medication.  When he came last time he saw McVey in September 2017.  He was only prescribed one month of the medication.  He thinks this was because he told her [place] and was planning on establishing care there.  So the pharmacy has been calling us for an urgent refill every month since that time.  They told him they would not do this any longer.  [place] and has an appointment for March.  Can we refill his medicine until then?  [phone]

## 2016-09-02 MED ORDER — LEVOTHYROXINE SODIUM 125 MCG PO TABS: ORAL_TABLET | ORAL | 0 refills | Status: DC

## 2016-09-04 ENCOUNTER — Telehealth: Payer: Self-pay

## 2016-09-04 ENCOUNTER — Other Ambulatory Visit: Payer: Self-pay | Admitting: Emergency Medicine

## 2016-09-04 MED ORDER — LEVOTHYROXINE SODIUM 125 MCG PO TABS: ORAL_TABLET | ORAL | 0 refills | Status: AC

## 2016-09-04 MED ORDER — LEVOTHYROXINE SODIUM 125 MCG PO TABS: ORAL_TABLET | ORAL | 2 refills | Status: AC

## 2016-09-04 NOTE — Telephone Encounter (Signed)
Pt's levothyroxine was called in to the wrong pharmacy. He needs it called in to the [facility] at [address] in [place]. Thank you.

## 2018-07-12 DIAGNOSIS — Z125 Encounter for screening for malignant neoplasm of prostate: Secondary | ICD-10-CM | POA: Diagnosis not present

## 2018-07-12 DIAGNOSIS — Z13220 Encounter for screening for lipoid disorders: Secondary | ICD-10-CM | POA: Diagnosis not present

## 2018-07-12 DIAGNOSIS — E039 Hypothyroidism, unspecified: Secondary | ICD-10-CM | POA: Diagnosis not present

## 2018-07-12 DIAGNOSIS — Z0000 Encounter for general adult medical examination without abnormal findings: Secondary | ICD-10-CM | POA: Diagnosis not present

## 2018-07-12 DIAGNOSIS — Z1389 Encounter for screening for other disorder: Secondary | ICD-10-CM | POA: Diagnosis not present
